# Patient Record
Sex: Female | Born: 1980 | Hispanic: Yes | Smoking: Never smoker
Health system: Southern US, Community
[De-identification: ages and names within clinical notes are randomized; demographics above are authoritative.]

## PROBLEM LIST (undated history)

## (undated) DIAGNOSIS — Z789 Other specified health status: Secondary | ICD-10-CM

## (undated) HISTORY — PX: NO PAST SURGERIES: SHX2092

## (undated) HISTORY — DX: Other specified health status: Z78.9

---

## 2018-11-17 ENCOUNTER — Ambulatory Visit (HOSPITAL_COMMUNITY): Payer: Self-pay | Admitting: Obstetrics & Gynecology

## 2018-11-17 ENCOUNTER — Other Ambulatory Visit (HOSPITAL_COMMUNITY): Payer: Self-pay | Admitting: Obstetrics & Gynecology

## 2018-11-17 ENCOUNTER — Ambulatory Visit (HOSPITAL_COMMUNITY)
Admission: RE | Admit: 2018-11-17 | Discharge: 2018-11-17 | Disposition: A | Discharge status: Home / Self Care | Payer: Self-pay | Source: Ambulatory Visit | Attending: Obstetrics and Gynecology | Admitting: Obstetrics and Gynecology

## 2018-11-17 ENCOUNTER — Encounter (HOSPITAL_COMMUNITY): Payer: Self-pay

## 2018-11-17 ENCOUNTER — Ambulatory Visit (HOSPITAL_COMMUNITY): Payer: Self-pay | Admitting: Obstetrics and Gynecology

## 2018-11-17 ENCOUNTER — Ambulatory Visit (HOSPITAL_COMMUNITY): Payer: Self-pay

## 2018-11-17 ENCOUNTER — Other Ambulatory Visit: Payer: Self-pay

## 2018-11-17 ENCOUNTER — Ambulatory Visit (HOSPITAL_COMMUNITY): Payer: Self-pay | Admitting: *Deleted

## 2018-11-17 VITALS — BP 113/63 | HR 76 | Temp 98.1°F | Ht 63.0 in | Wt 155.2 lb

## 2018-11-17 DIAGNOSIS — O09521 Supervision of elderly multigravida, first trimester: Secondary | ICD-10-CM

## 2018-11-17 DIAGNOSIS — Z3682 Encounter for antenatal screening for nuchal translucency: Secondary | ICD-10-CM

## 2018-11-17 DIAGNOSIS — O09529 Supervision of elderly multigravida, unspecified trimester: Secondary | ICD-10-CM

## 2018-11-17 DIAGNOSIS — Z3A11 11 weeks gestation of pregnancy: Secondary | ICD-10-CM

## 2018-11-17 DIAGNOSIS — Z3687 Encounter for antenatal screening for uncertain dates: Secondary | ICD-10-CM

## 2018-11-17 IMAGING — US US MFM FETAL NUCHAL TRANSLUCENCY
1 series · 14 of 28 positions shown · non-contrast
Comparison: none

[Series 1: us mfm fetal nuchal translucency · 14 of 33 slices shown]
[im 2/33]
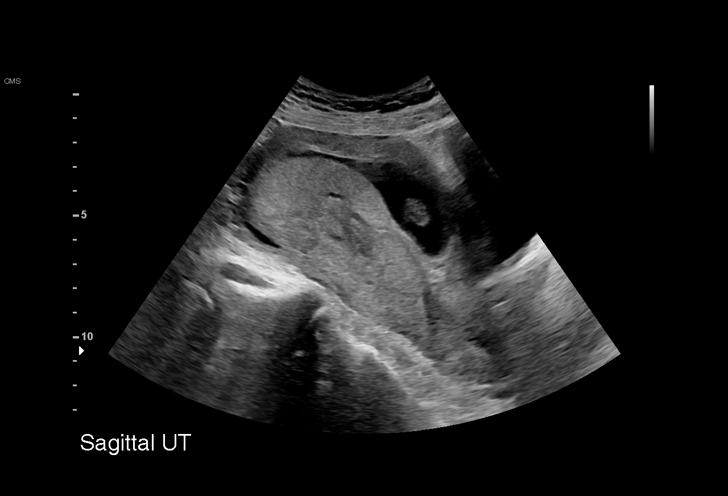
[im 4/33]
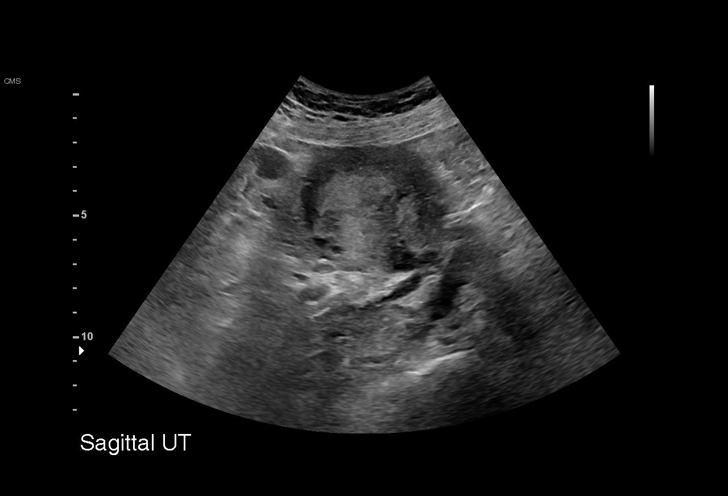
[im 6/33]
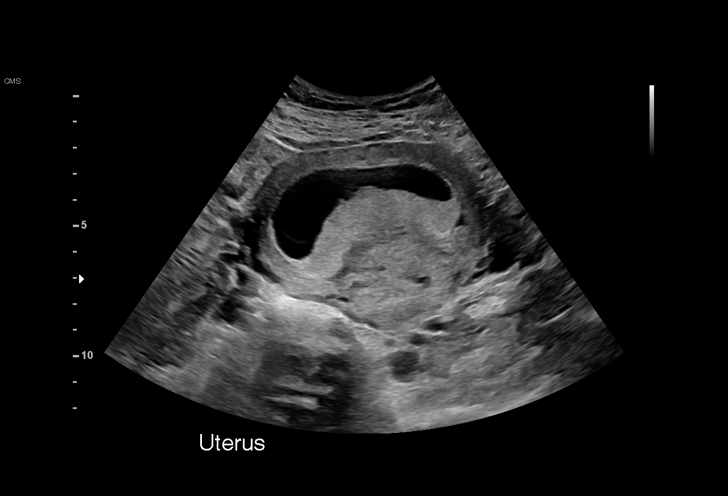
[im 9/33]
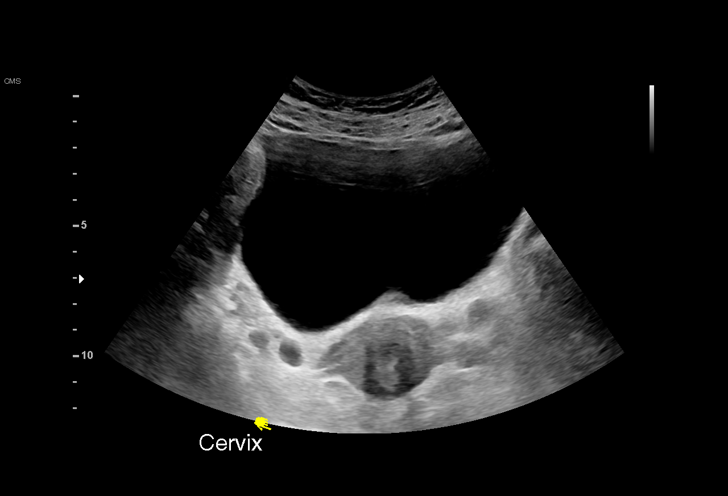
[im 11/33]
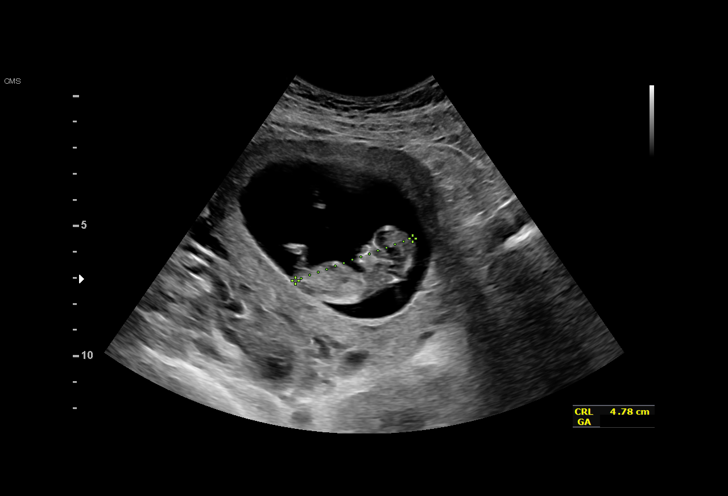
[im 14/33]
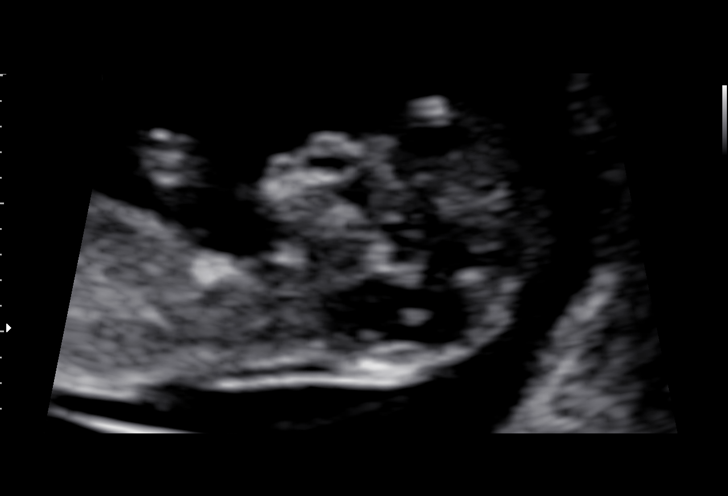
[im 16/33]
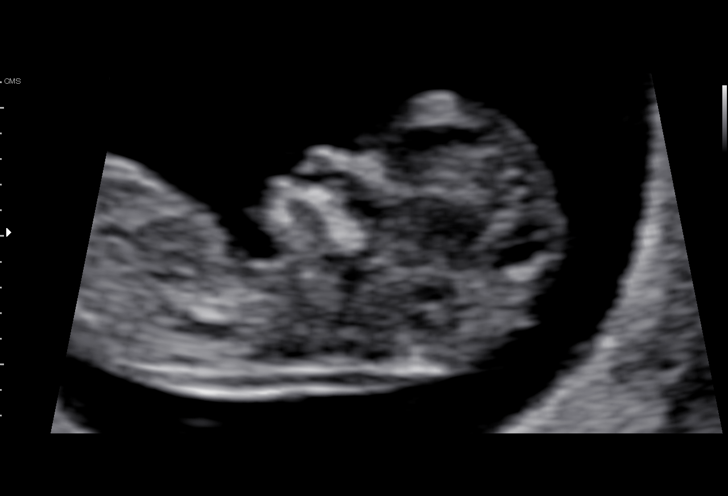
[im 18/33]
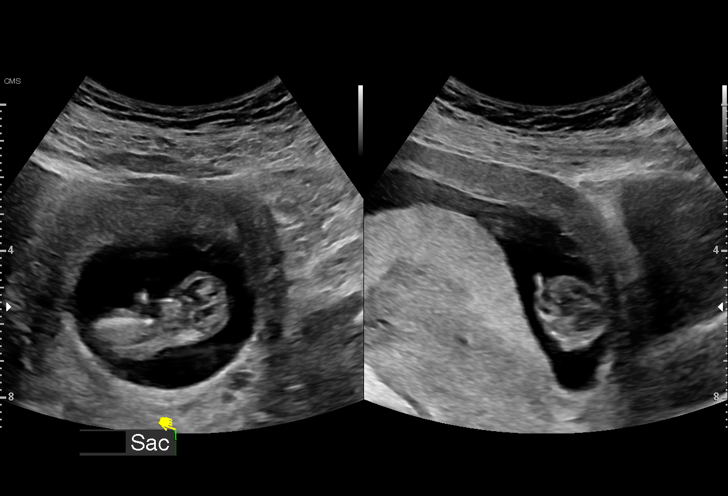
[im 21/33]
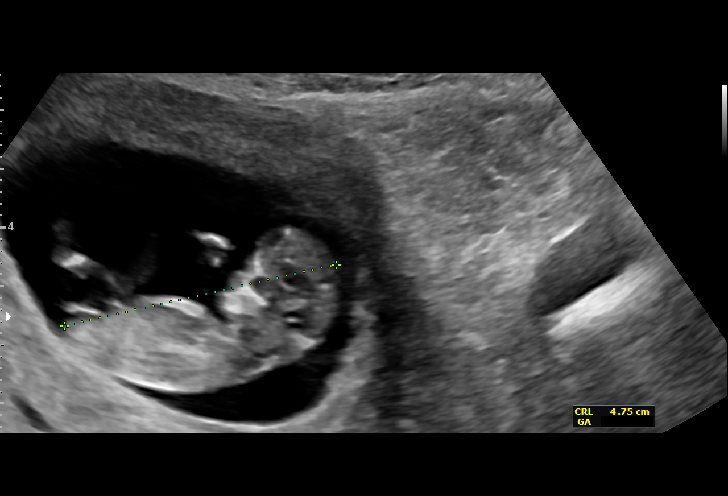
[im 23/33]
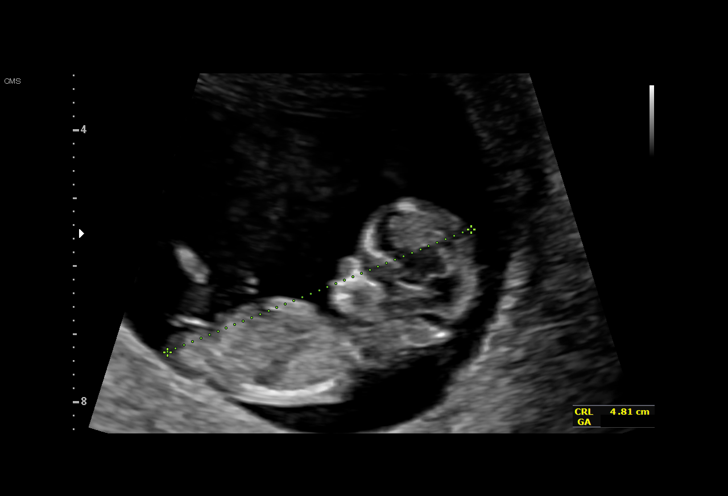
[im 25/33]
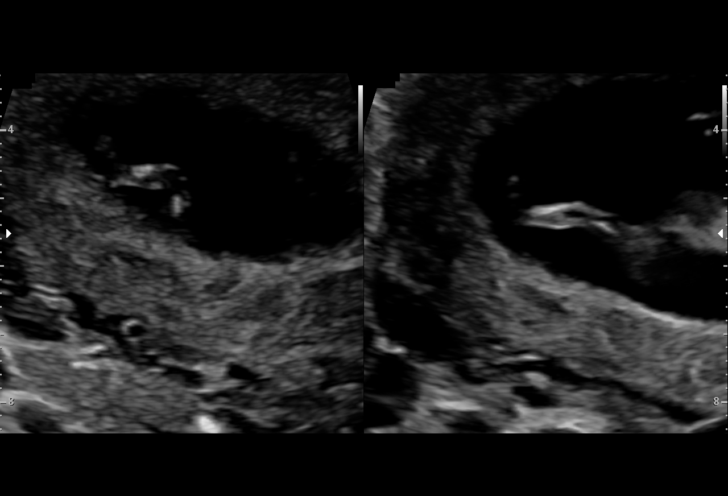
[im 28/33]
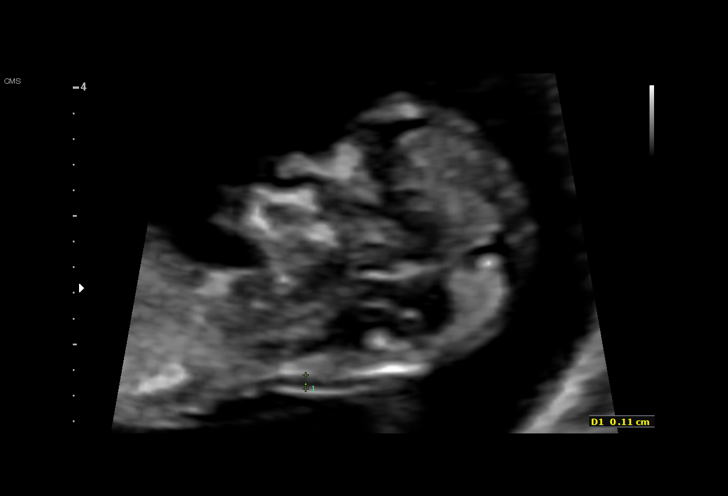
[im 30/33]
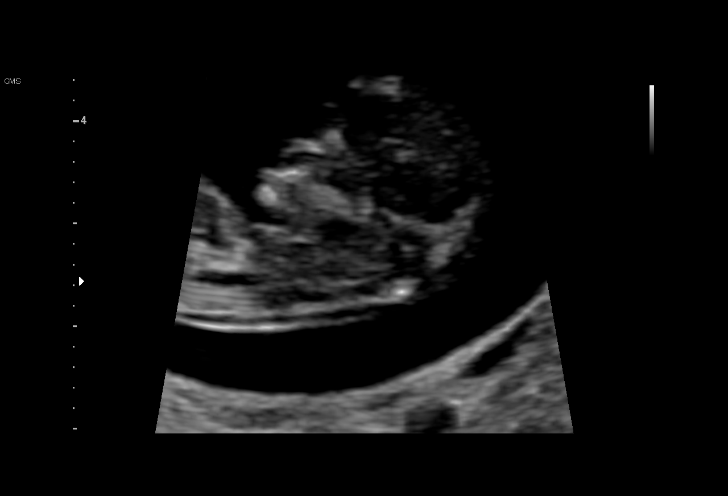
[im 33/33]
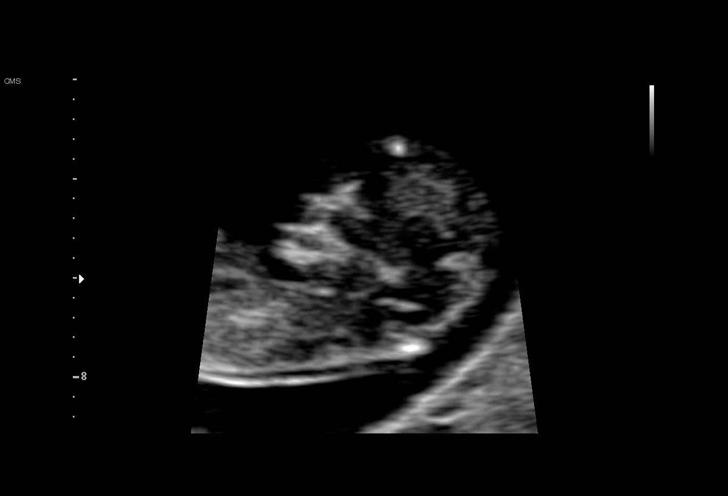

[14 of 28 positions shown; findings below may reference images not displayed]

[REDACTED]-
                   Faculty Physician

     TRANSLUCENCY
 ----------------------------------------------------------------------

 ----------------------------------------------------------------------
Indications

  Encounter for uncertain dates                  [VR]
  Encounter for nuchal translucency              [VR]
  Advanced maternal age multigravida 38, first   [VR]
  trimester
  11 weeks gestation of pregnancy
 ----------------------------------------------------------------------
Fetal Evaluation

 Num Of Fetuses:         1
 Preg. Location:         Intrauterine
 Gest. Sac:              Intrauterine
 Yolk Sac:               Visualized
 Fetal Pole:             Visualized
 Fetal Heart Rate(bpm):  167
 Cardiac Activity:       Observed

 Amniotic Fluid
 AFI FV:      Within normal limits
Biometry

 CRL:      48.1  mm     G. Age:  11w 3d                  EDD:   [DATE]
OB History

 Gravidity:    6         Term:   4        Prem:   0        SAB:   1
 TOP:          0       Ectopic:  0        Living: 4
Gestational Age

 LMP:           13w 1d        Date:  [DATE]                 EDD:   [DATE]
 Best:          11w 3d     Det. By:  U/S C R L ([DATE])     EDD:   [DATE]
1st Trimester Genetic Sonogram Screening

 CRL:            48.1  mm    G. Age:   11w 3d                 EDD:   [DATE]
 Nuc Trans:       1.1  mm

 Nasal Bone:                 Present
Cervix Uterus Adnexa

 Cervix
 Closed.

 Uterus
 No abnormality visualized.

 Left Ovary
 Not visualized.

 Right Ovary
 Not visualized.

 Cul De Sac
 No free fluid seen.

 Adnexa
 No abnormality visualized.
Impression

 On ultrasound, the CRL measurement is consistent with 11w
 1d gestation and good fetal heart activity is seen. The nuchal
 translucency (NT) measures 1.1 millimeters, which is normal.
 Fetal anatomy that could be ascertained at this gestational
 age is normal.

 We have assigned her EDD at [DATE].

 Patient met with our genetic counselor today after ultrasound.
 You will be receiving a separate letter. Patient opted to have
 cell-free fetal DNA screening. Blood was drawn today for cell-
 free fetal DNA screening and inheritest. We will communicate
 the results to the patient.
Recommendations

 Fetal anatomy scan at 18-20 weeks.
                 ARGAZ

## 2018-11-17 NOTE — Progress Notes (Signed)
Patient in session with Genetic Counselor, Control and instrumentation engineer. Interpreter present. Patient to have Inheritest Core and Mat 21 drawn today.

## 2018-12-01 LAB — OB RESULTS CONSOLE ANTIBODY SCREEN: Antibody Screen: NEGATIVE

## 2018-12-01 LAB — OB RESULTS CONSOLE HEPATITIS B SURFACE ANTIGEN: Hepatitis B Surface Ag: NEGATIVE

## 2018-12-01 LAB — OB RESULTS CONSOLE RUBELLA ANTIBODY, IGM: Rubella: NON-IMMUNE/NOT IMMUNE

## 2018-12-01 LAB — OB RESULTS CONSOLE HIV ANTIBODY (ROUTINE TESTING): HIV: NONREACTIVE

## 2018-12-01 LAB — OB RESULTS CONSOLE VARICELLA ZOSTER ANTIBODY, IGG: Varicella: IMMUNE

## 2018-12-01 LAB — OB RESULTS CONSOLE GC/CHLAMYDIA
Chlamydia: NEGATIVE
Gonorrhea: NEGATIVE

## 2018-12-01 LAB — OB RESULTS CONSOLE ABO/RH: RH Type: POSITIVE

## 2018-12-05 LAB — MATERNIT 21 PLUS CORE, BLOOD
Fetal Fraction: 11
Result (T21): NEGATIVE
Trisomy 13 (Patau syndrome): NEGATIVE
Trisomy 18 (Edwards syndrome): NEGATIVE
Trisomy 21 (Down syndrome): NEGATIVE

## 2018-12-05 LAB — INHERITEST CORE(CF97,SMA,FRAX)

## 2019-01-31 ENCOUNTER — Encounter (HOSPITAL_COMMUNITY): Payer: Self-pay

## 2019-01-31 ENCOUNTER — Inpatient Hospital Stay (HOSPITAL_BASED_OUTPATIENT_CLINIC_OR_DEPARTMENT_OTHER): Payer: Self-pay

## 2019-01-31 ENCOUNTER — Other Ambulatory Visit: Payer: Self-pay

## 2019-01-31 ENCOUNTER — Inpatient Hospital Stay (HOSPITAL_COMMUNITY)
Admission: AD | Admit: 2019-01-31 | Discharge: 2019-01-31 | Disposition: A | Discharge status: Home / Self Care | Payer: Self-pay | Attending: Obstetrics & Gynecology | Admitting: Obstetrics & Gynecology

## 2019-01-31 DIAGNOSIS — R1030 Lower abdominal pain, unspecified: Secondary | ICD-10-CM | POA: Insufficient documentation

## 2019-01-31 DIAGNOSIS — O26899 Other specified pregnancy related conditions, unspecified trimester: Secondary | ICD-10-CM

## 2019-01-31 DIAGNOSIS — R109 Unspecified abdominal pain: Secondary | ICD-10-CM

## 2019-01-31 DIAGNOSIS — O26892 Other specified pregnancy related conditions, second trimester: Secondary | ICD-10-CM | POA: Insufficient documentation

## 2019-01-31 DIAGNOSIS — T7431XS Adult psychological abuse, confirmed, sequela: Secondary | ICD-10-CM

## 2019-01-31 DIAGNOSIS — F439 Reaction to severe stress, unspecified: Secondary | ICD-10-CM

## 2019-01-31 DIAGNOSIS — Z3A22 22 weeks gestation of pregnancy: Secondary | ICD-10-CM

## 2019-01-31 LAB — URINALYSIS, ROUTINE W REFLEX MICROSCOPIC
Bilirubin Urine: NEGATIVE
Glucose, UA: NEGATIVE mg/dL
Hgb urine dipstick: NEGATIVE
Ketones, ur: 5 mg/dL — AB
Leukocytes,Ua: NEGATIVE
Nitrite: NEGATIVE
Protein, ur: NEGATIVE mg/dL
Specific Gravity, Urine: 1.006 (ref 1.005–1.030)
pH: 6 (ref 5.0–8.0)

## 2019-01-31 MED ORDER — IBUPROFEN 600 MG PO TABS
600.0000 mg | ORAL_TABLET | Freq: Once | ORAL | Status: AC
  Administered 2019-01-31: 600 mg via ORAL
  Filled 2019-01-31: qty 1
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
## 2019-01-31 NOTE — Discharge Instructions (Signed)
Violencia domstica y Radio broadcast assistant Violence and Pregnancy La violencia domstica es un tipo de dao fsico, sexual o emocional infligido por una ex o actual pareja. El abuso emocional incluye la conducta amenazante y de control. El acoso es un ejemplo de Scientist, physiological. Este tipo de abuso puede sucederle a Copy, en cualquier momento de una relacin. A la violencia domstica tambin se la conoce como violencia de pareja. La violencia de pareja es especialmente peligrosa durante el embarazo porque el abuso puede afectarlos tanto a usted como al beb en desarrollo. Llame a la lnea directa para violencia domstica o informe al mdico si est sufriendo abuso fsico o sexual, o si la Scientist, physiological o de control de su pareja hace que no se sienta segura. Jake Bathe y [employer] protege a usted, su embarazo y su beb. De qu modo la afecta a usted? Para usted, los [employer] de la violencia de pareja pueden incluir:  Lesin fsica o muerte (homicidio).  Estrs emocional y miedo.  Dificultad para dormir.  Prdida del apetito.  Problemas digestivos.  Infecciones frecuentes, incluso infecciones de transmisin sexual.  Presin arterial alta.  Depresin.  Ansiedad.  Pensamientos acerca de lastimarse o suicidarse. La violencia de pareja puede afectar de modo negativo su embarazo de estas formas:  Es ms probable que se lesione o que su salud sea deficiente.  Es menos probable que [place] cuidado prenatal.  Es posible que no tenga una buena nutricin ni que gane una cantidad saludable de [place].  Es ms probable que fume, consuma drogas y beba alcohol durante el embarazo para Programmer, multimedia.  Puede tener un riesgo mayor de perder Water quality scientist (aborto espontneo o muerte fetal intrauterina).  Su beb puede nacer antes de las 37semanas de embarazo (prematuro). De qu modo lo afecta al beb? Si sufre violencia de pareja durante el embarazo:  Su beb puede  lesionarse.  Es posible que el beb no sobreviva el embarazo (aborto espontneo o muerte fetal intrauterina).  El beb puede nacer prematuro, lo que puede provocar problemas mentales y fsicos.  El beb puede no crecer bien en el tero y puede nacer pequeo (pequeo para la edad gestacional).  Si consume alcohol, el beb puede nacer con sndrome alcohlico fetal, que produce defectos congnitos y [employer].  Es posible que tenga problemas para crear un vnculo con el beb, lo que puede conducir a negligencia.  Es menos probable que amamante, que es la forma ms saludable de Research scientist (life sciences) al beb. Siga estas indicaciones en su casa:   Informe al mdico que est sufriendo violencia de pareja.  No lleve a una pareja abusiva a las visitas prenatales. Esto le permitir hablar libremente con su mdico.  Infrmese sobre recursos sobre violencia de pareja y selos, por ejemplo la Lnea Directa Nacional sobre Violencia Domstica (National Domestic Violence Hotline): VisitDestination.com.br.  Pharmacologist terapia.  Considere obtener un documento legal que diga que su pareja debe mantenerse alejada de usted (interdicto).  Pdale a una persona de apoyo que se quede con usted en su casa.  Tenga un plan de escape para ir a un sitio seguro.  No fume ni consuma drogas ni alcohol para aliviar el estrs.  Concurra a todas las visitas prenatales tal como se lo haya indicado el mdico. Esto es importante. Dnde encontrar ms informacin:  Futuros sin Violencia (Futures Without Violence): futureswithoutviolence.[place] ([place]): https://taylor-mendoza.com/  Red Nacional para Terminar con la [employer]  Domstica Advertising copywriter to UnitedHealth Violence): InsuranceSquad.es  [employer] de Recursos sobre Catering manager ([employer] on Domestic Violence): ARanked.fi  El Departamento de Justicia de los Estados Unidos (U.S.  Department of Justice), Oficina sobre Violencia contra Architectural technologist (Office on Violence Against Women): BeginnerSteps.com.ee Comunquese con un mdico si:  Sufre cualquier tipo de violencia de pareja en su casa.  Necesita ayuda para dejar de fumar, beber o consumir drogas. Solicite ayuda de inmediato si:  No se siente seguro en su casa.  Tiene pensamientos acerca de lastimarse o suicidarse. Si alguna vez siente que puede lastimarse a usted mismo o a Economist, o tiene pensamientos de poner fin a su vida, busque ayuda de inmediato. Puede dirigirse al servicio de emergencias ms cercano o comunicarse con:  El servicio de emergencias de su localidad (911 en [place].).  Una lnea de asistencia al suicida y Visual merchandiser en crisis, como la [employer] de Prevencin del Suicidio (National Suicide Prevention Lifeline), al [phone]. Est disponible las 24 horas del da. Resumen  A la violencia domstica tambin se la conoce como violencia de pareja. La violencia de pareja puede ser fsica, sexual o emocional.  Este tipo de abuso puede sucederle a cualquiera, en cualquier momento de Administrator, arts, pero es especialmente peligrosa durante el embarazo.  La violencia de pareja incrementa el riesgo de aborto espontneo, muerte fetal intrauterina y nacimiento prematuro del beb.  Informe a su mdico si sufre cualquier tipo de violencia de pareja. Obtenga ayuda de inmediato si no se siente segura en su casa. Esta informacin no tiene Theme park manager el consejo del mdico. Asegrese de hacerle al mdico cualquier pregunta que tenga. Document Released: 03/27/2017 Document Revised: 03/27/2017 Document Reviewed: 03/27/2017 Elsevier Patient Education  2020 [employer].

## 2019-01-31 NOTE — Progress Notes (Signed)
[name] went to speak with pt at bedside as [name] mae aware that patient has requested to speak with [name]. [name] entered room with spanish speaking interpretor Alex. [name] introduced role and advised pt of the reason for [name] coming to visit with her. As [name] and pt began to talk, another individual entered the room from ultrasound to get pt. [name] advised pt that [name] would wait for her to return as [name] made aware that it would only be 10 minutes. [name] updated RN to call [name] once pt has returned to the room. RN agreeable.     Virgie Dad Kameisha Malicki, MSW, LCSW [facility] [phone]
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
## 2019-04-24 NOTE — L&D Delivery Note (Signed)
OB/GYN Faculty Practice Delivery Note  Patricia House is a 39 y.o. [id number] s/p NSVD at [redacted]w[redacted]d. She was admitted for spontaneous labor.   ROM: 1h 37m with clear fluid GBS Status: unknown    Labor Progress: . Patient arrived at full dilation.   Delivery Date/Time: 06/01/2019 at 0124 Delivery: Called to room and patient was complete and pushing. Head delivered in LOA position. No nuchal cord present. Shoulder and body delivered in usual fashion. Infant with spontaneous cry, placed on mother's abdomen, dried and stimulated. Cord clamped x 2 after 1-minute delay, and cut by [name]. Cord blood drawn. Placenta delivered spontaneously with gentle cord traction. Fundus firm with massage and Pitocin. Labia, perineum, vagina, and cervix inspected with L periuretheral that was hemostatic and not repaired.   Placenta: 3v intact, to L&D Complications: none Lacerations: L periuretheral that was hemostatic and not repaired EBL: 300 cc Analgesia: none   Infant: APGAR (1 MIN): 8   APGAR (5 MINS): 9    Weight: 3430 grams  Zack Seal, MD/MPH OB/GYN Fellow, Faculty Practice

## 2019-06-01 ENCOUNTER — Encounter (HOSPITAL_COMMUNITY): Payer: Self-pay | Admitting: Obstetrics and Gynecology

## 2019-06-01 ENCOUNTER — Inpatient Hospital Stay (HOSPITAL_COMMUNITY)
Admission: AD | Admit: 2019-06-01 | Discharge: 2019-06-02 | DRG: 807 | Disposition: A | Discharge status: Home / Self Care | Payer: Medicaid Other | Attending: Obstetrics and Gynecology | Admitting: Obstetrics and Gynecology

## 2019-06-01 DIAGNOSIS — Z3A39 39 weeks gestation of pregnancy: Secondary | ICD-10-CM

## 2019-06-01 DIAGNOSIS — Z20822 Contact with and (suspected) exposure to covid-19: Secondary | ICD-10-CM | POA: Diagnosis present

## 2019-06-01 DIAGNOSIS — O26893 Other specified pregnancy related conditions, third trimester: Secondary | ICD-10-CM | POA: Diagnosis present

## 2019-06-01 LAB — CBC
HCT: 37.7 % (ref 36.0–46.0)
Hemoglobin: 12.9 g/dL (ref 12.0–15.0)
MCH: 28.1 pg (ref 26.0–34.0)
MCHC: 34.2 g/dL (ref 30.0–36.0)
MCV: 82.1 fL (ref 80.0–100.0)
Platelets: 302 10*3/uL (ref 150–400)
RBC: 4.59 MIL/uL (ref 3.87–5.11)
RDW: 14.1 % (ref 11.5–15.5)
WBC: 11.3 10*3/uL — ABNORMAL HIGH (ref 4.0–10.5)
nRBC: 0 % (ref 0.0–0.2)

## 2019-06-01 LAB — ABO/RH: ABO/RH(D): A POS

## 2019-06-01 LAB — TYPE AND SCREEN
ABO/RH(D): A POS
Antibody Screen: NEGATIVE

## 2019-06-01 LAB — RESPIRATORY PANEL BY RT PCR (FLU A&B, COVID)
Influenza A by PCR: NEGATIVE
Influenza B by PCR: NEGATIVE
SARS Coronavirus 2 by RT PCR: NEGATIVE

## 2019-06-01 LAB — GROUP B STREP BY PCR: Group B strep by PCR: NEGATIVE

## 2019-06-01 LAB — RPR: RPR Ser Ql: NONREACTIVE

## 2019-06-01 MED ORDER — PRENATAL MULTIVITAMIN CH
1.0000 | ORAL_TABLET | Freq: Every day | ORAL | Status: DC
  Administered 2019-06-01 – 2019-06-02 (×2): 1 via ORAL
  Filled 2019-06-01 (×2): qty 1

## 2019-06-01 MED ORDER — IBUPROFEN 600 MG PO TABS
600.0000 mg | ORAL_TABLET | Freq: Four times a day (QID) | ORAL | Status: DC
  Administered 2019-06-01 – 2019-06-02 (×6): 600 mg via ORAL
  Filled 2019-06-01 (×6): qty 1

## 2019-06-01 MED ORDER — OXYCODONE HCL 5 MG PO TABS: 10.0000 mg | ORAL_TABLET | ORAL | Status: DC | PRN

## 2019-06-01 MED ORDER — DIBUCAINE (PERIANAL) 1 % EX OINT: 1.0000 "application " | TOPICAL_OINTMENT | CUTANEOUS | Status: DC | PRN

## 2019-06-01 MED ORDER — OXYTOCIN 40 UNITS IN NORMAL SALINE INFUSION - SIMPLE MED
2.5000 [IU]/h | INTRAVENOUS | Status: DC
  Administered 2019-06-01: 39.96 [IU]/h via INTRAVENOUS

## 2019-06-01 MED ORDER — METHYLERGONOVINE MALEATE 0.2 MG/ML IJ SOLN
INTRAMUSCULAR | Status: AC
  Filled 2019-06-01: qty 1

## 2019-06-01 MED ORDER — MAGNESIUM HYDROXIDE 400 MG/5ML PO SUSP: 30.0000 mL | ORAL | Status: DC | PRN

## 2019-06-01 MED ORDER — ONDANSETRON HCL 4 MG PO TABS: 4.0000 mg | ORAL_TABLET | ORAL | Status: DC | PRN

## 2019-06-01 MED ORDER — ONDANSETRON HCL 4 MG/2ML IJ SOLN: 4.0000 mg | INTRAMUSCULAR | Status: DC | PRN

## 2019-06-01 MED ORDER — SENNOSIDES-DOCUSATE SODIUM 8.6-50 MG PO TABS
2.0000 | ORAL_TABLET | ORAL | Status: DC
  Administered 2019-06-02: 2 via ORAL
  Filled 2019-06-01: qty 2

## 2019-06-01 MED ORDER — COCONUT OIL OIL: 1.0000 "application " | TOPICAL_OIL | Status: DC | PRN

## 2019-06-01 MED ORDER — DIPHENHYDRAMINE HCL 25 MG PO CAPS: 25.0000 mg | ORAL_CAPSULE | Freq: Four times a day (QID) | ORAL | Status: DC | PRN

## 2019-06-01 MED ORDER — OXYCODONE HCL 5 MG PO TABS: 5.0000 mg | ORAL_TABLET | ORAL | Status: DC | PRN

## 2019-06-01 MED ORDER — WITCH HAZEL-GLYCERIN EX PADS: 1.0000 "application " | MEDICATED_PAD | CUTANEOUS | Status: DC | PRN

## 2019-06-01 MED ORDER — METHYLERGONOVINE MALEATE 0.2 MG/ML IJ SOLN
0.2000 mg | Freq: Once | INTRAMUSCULAR | Status: AC
  Administered 2019-06-01: 0.2 mg via INTRAMUSCULAR

## 2019-06-01 MED ORDER — MEDROXYPROGESTERONE ACETATE 150 MG/ML IM SUSP: 150.0000 mg | Freq: Once | INTRAMUSCULAR | Status: DC

## 2019-06-01 MED ORDER — ACETAMINOPHEN 325 MG PO TABS: 650.0000 mg | ORAL_TABLET | ORAL | Status: DC | PRN

## 2019-06-01 MED ORDER — BENZOCAINE-MENTHOL 20-0.5 % EX AERO: 1.0000 "application " | INHALATION_SPRAY | CUTANEOUS | Status: DC | PRN

## 2019-06-01 MED ORDER — SIMETHICONE 80 MG PO CHEW: 80.0000 mg | CHEWABLE_TABLET | ORAL | Status: DC | PRN

## 2019-06-01 MED ORDER — MEASLES, MUMPS & RUBELLA VAC IJ SOLR
0.5000 mL | Freq: Once | INTRAMUSCULAR | Status: AC
  Administered 2019-06-02: 0.5 mL via SUBCUTANEOUS
  Filled 2019-06-01: qty 0.5

## 2019-06-01 NOTE — ED Provider Notes (Signed)
MSE was initiated and I personally evaluated the patient and placed orders (if any) at  1:01 AM on June 01, 2019.  The patient appears stable so that the remainder of the MSE may be completed by another provider.   Nonie Lochner, Barbara Cower, MD 06/02/19 [phone]

## 2019-06-01 NOTE — Discharge Summary (Signed)
Postpartum Discharge Summary     Patient Name: Patricia House DOB: 1980/08/09 MRN: [id number]  Date of admission: 06/01/2019 Delivering Provider: Clarnce Flock   Date of discharge: 06/02/2019  Admitting diagnosis: NSVD (normal spontaneous vaginal delivery) [O80] Intrauterine pregnancy: [redacted]w[redacted]d    Secondary diagnosis:  Active Problems:   NSVD (normal spontaneous vaginal delivery)  Additional problems: Precipitous delivery     Discharge diagnosis: Term Pregnancy Delivered                                                                                                Post partum procedures: None  Augmentation: none  Complications: None  Hospital course:  Onset of Labor With Vaginal Delivery     39y.o. yo [id number] admitted in Active Labor on 06/01/2019. Patient had an uncomplicated labor course as follows: arrived fully dilated after SROM for clear at home and precipitously delivered Membrane Rupture Time/Date: 12:00 AM ,06/01/2019   Intrapartum Procedures: Episiotomy: None [1]                                         Lacerations:  Periurethral [8]  Patient had a delivery of a Viable infant. 06/01/2019  Information for the patient's newborn:  Ramos-Reyes, Girl DaCarmella0[[id number]]Delivery Method: Vag-Spont     Pateint had an uncomplicated postpartum course.  She is ambulating, tolerating a regular diet, passing flatus, and urinating well. Patient is discharged home in stable condition on 06/02/19.  Delivery time: 1:24 AM    Magnesium Sulfate received: No BMZ received: No Rhophylac:N/A MMR:Yes Transfusion:No  Physical exam  Vitals:   06/01/19 0900 06/01/19 1438 06/01/19 2141 06/02/19 0533  BP: 102/67 98/60 (!) 97/58 (!) 90/50  Pulse: 82 82 64 64  Resp: 17 17  18   Temp: 98.2 F (36.8 C) 98.4 F (36.9 C) 98.8 F (37.1 C) (!) 97.5 F (36.4 C)  TempSrc: Oral Oral Oral Oral  SpO2:  98%     General: alert, cooperative and no distress Lochia:  appropriate Uterine Fundus: firm Incision: N/A DVT Evaluation: No evidence of DVT seen on physical exam. Labs: Lab Results  Component Value Date   WBC 11.3 (H) 06/01/2019   HGB 12.9 06/01/2019   HCT 37.7 06/01/2019   MCV 82.1 06/01/2019   PLT 302 06/01/2019   No flowsheet data found.  Discharge instruction: per After Visit Summary and "Baby and Me Booklet".  After visit meds:  Allergies as of 06/02/2019   No Known Allergies     Medication List    TAKE these medications   acetaminophen 325 MG tablet Commonly known as: Tylenol Take 2 tablets (650 mg total) by mouth every 4 (four) hours as needed (for pain scale < 4).   ibuprofen 600 MG tablet Commonly known as: ADVIL Take 1 tablet (600 mg total) by mouth every 6 (six) hours.   oxyCODONE 5 MG immediate release tablet Commonly known as: Oxy IR/ROXICODONE Take 1 tablet (5 mg total) by mouth every 4 (  four) hours as needed (pain scale 4-7).   PRENATAL VITAMIN PO Take by mouth.       Diet: routine diet  Activity: Advance as tolerated. Pelvic rest for 6 weeks.   Outpatient follow up:6 weeks Follow up Appt:No future appointments. Follow up Visit:    Please schedule this patient for Postpartum visit in: 6 weeks with the following provider: Any provider In-Person For C/S patients schedule nurse incision check in weeks 2 weeks: no Low risk pregnancy complicated by: n/a Delivery mode:  SVD Anticipated Birth Control:  Depo prior to discharge, would like LARC at follow PP Procedures needed: LARC placement  Schedule Integrated [place] visit: no     Newborn Data: Live born female  Birth Weight:  3430 g APGAR: 50, 9  Newborn Delivery   Birth date/time: 06/01/2019 01:24:00 Delivery type: Vaginal, Spontaneous      Baby Feeding: Breast Disposition:home with mother   06/02/2019 Merilyn Baba, DO

## 2019-06-01 NOTE — Lactation Note (Addendum)
This note was copied from a baby's chart. Lactation Consultation Note  Patient Name: Patricia House Date: 06/01/2019 Reason for consult: Initial assessment;Term P5, 3 hour female infant. Spanish interpreter used # [phone] Lenor Coffin is active on the [facility] program in [facility] she doesn't have a breast pump at home.  Mom is experienced at breastfeeding she breastfeed her other 4 children for 4 years each. Mom feels infant is breastfeeding well and has no concerns at this time, mom was breastfeeding infant on her right breast in side lying position when [facility] enter room LC did not assist mom with latch.  Infant breastfeed for 7 minutes.  Mom will breastfeed infant according hunger cues, on demand, 8 to 12 times within 24 hours and not exceed 3 hours without breastfeeding infant. Mom knows to call RN or LC if she has any questions, concerns or need any assistance with latching infant. Mom shown how to use hand pump  & how to disassemble, clean, & reassemble parts. Reviewed Baby & Me book's Breastfeeding Basics.  Mom made aware of O/P services, breastfeeding support groups, community resources, and our phone # for post-discharge questions.   Maternal Data Formula Feeding for Exclusion: No Has patient been taught Hand Expression?: Yes Does the patient have breastfeeding experience prior to this delivery?: Yes  Feeding Feeding Type: Breast Fed  LATCH Score Latch: Grasps breast easily, tongue down, lips flanged, rhythmical sucking.  Audible Swallowing: A few with stimulation  Type of Nipple: Everted at rest and after stimulation  Comfort (Breast/Nipple): Soft / non-tender  Hold (Positioning): No assistance needed to correctly position infant at breast.  LATCH Score: 9  Interventions Interventions: Breast feeding basics reviewed;Skin to skin  Lactation Tools Discussed/Used WIC Program: Yes Pump Review: Setup, frequency, and cleaning;Milk Storage Initiated by::  Danelle Earthly, IBCLC Date initiated:: 06/01/19   Consult Status Consult Status: Follow-up Date: 06/01/19 Follow-up type: In-patient    Danelle Earthly 06/01/2019, 4:30 AM
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
## 2019-06-01 NOTE — MAU Note (Signed)
Pt transferred via wheelchair from ED and left at registration desk. Pt SROM'd at home and has an urge to push. Pt 10/100/+1 and vertex. L&D charge nurse called and given report. Pt to go to room 216. Transferred via stretcher by RN, CNM, and NT.

## 2019-06-01 NOTE — Lactation Note (Signed)
This note was copied from a baby's chart. Lactation Consultation Note  Patient Name: Patricia House JYLTE'I Date: 06/01/2019 Reason for consult: Follow-up assessment   P5 mother whose infant is now 7 hours old.  Mother breast fed her other 4 children for 2 years each.  The youngest one is 37 years old.  In house Spanish interpreter used for interpretation.  Baby was swaddled and asleep at mother's side when I arrived.  Mother had no questions/concerns related to breast feeding.  Encouraged to feed 8-12 times/24 hours or sooner if baby shows feeding cues.  She will call for latch assistance as needed.  Mother does not have a DEBP for home use.  She does not have a desire to obtain a DEBP.  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  Father present.  RN updated.      Consult Status Consult Status: Follow-up Date: 06/02/19 Follow-up type: In-patient    Patricia House 06/01/2019, 10:18 AM

## 2019-06-01 NOTE — H&P (Signed)
LABOR AND DELIVERY ADMISSION HISTORY AND PHYSICAL NOTE  Patricia House is a 38 y.o. female G6P4014 with IUP at [redacted]w[redacted]d by patient report presenting for spontaneous labor.   Patient reported she had SROM at home around 0000 Delivered precipitously after arrival to the floor  She plans on breast feeding. Her contraception plan is: Depo.  Prenatal History/Complications: PNC at [facility] No   Pregnancy complications:  - None  Past Medical History: Past Medical History:  Diagnosis Date  . Medical history non-contributory     Past Surgical History: Past Surgical History:  Procedure Laterality Date  . NO PAST SURGERIES      Obstetrical History: OB History    Gravida  6   Para  4   Term  4   Preterm      AB  1   Living  4     SAB  1   TAB      Ectopic      Multiple      Live Births              Social History: Social History   Socioeconomic History  . Marital status: Unknown    Spouse name: Not on file  . Number of children: Not on file  . Years of education: Not on file  . Highest education level: Not on file  Occupational History  . Not on file  Tobacco Use  . Smoking status: Never Smoker  . Smokeless tobacco: Never Used  Substance and Sexual Activity  . Alcohol use: Not Currently  . Drug use: Never  . Sexual activity: Yes    Birth control/protection: None  Other Topics Concern  . Not on file  Social History Narrative  . Not on file   Social Determinants of Health   Financial Resource Strain:   . Difficulty of Paying Living Expenses: Not on file  Food Insecurity:   . Worried About Running Out of Food in the Last Year: Not on file  . Ran Out of Food in the Last Year: Not on file  Transportation Needs:   . Lack of Transportation (Medical): Not on file  . Lack of Transportation (Non-Medical): Not on file  Physical Activity:   . Days of Exercise per Week: Not on file  . Minutes of Exercise per Session: Not on file  Stress:   .  Feeling of Stress : Not on file  Social Connections:   . Frequency of Communication with Friends and Family: Not on file  . Frequency of Social Gatherings with Friends and Family: Not on file  . Attends Religious Services: Not on file  . Active Member of Clubs or Organizations: Not on file  . Attends Club or Organization Meetings: Not on file  . Marital Status: Not on file    Family History: No family history on file.  Allergies: No Known Allergies  Medications Prior to Admission  Medication Sig Dispense Refill Last Dose  . Prenatal Vit-Fe Fumarate-FA (PRENATAL VITAMIN PO) Take by mouth.        Review of Systems  All systems reviewed and negative except as stated in HPI  Physical Exam Blood pressure 121/76, pulse (!) 134, resp. rate (!) 22, last menstrual period 08/17/2018, SpO2 98 %. General appearance: alert, oriented, NAD Lungs: normal respiratory effort Heart: regular rate Abdomen: soft, non-tender Extremities: No calf swelling or tenderness   Dilation: 10 Effacement (%): 100 Station: Plus 1 Exam by:: Danielle Simpson RN  Prenatal labs: ABO, Rh: --/--/PENDING (  02/08 0125) A+ per [facility] records Antibody: PENDING (02/08 0125) Neg Rubella:  Non-immune RPR:   NR HBsAg:    HIV:   NR GC/Chlamydia: -/neg 12/01/2018  GBS:   unknown 2-hr GTT: 1hr normal Genetic screening:  Quad screen neg Anatomy US: bilateral renal pelvic fullness  Prenatal Transfer Tool  Maternal Diabetes: No Genetic Screening: Normal Maternal Ultrasounds/Referrals: Fetal Kidney Anomalies Fetal Ultrasounds or other Referrals:  None Maternal Substance Abuse:  No Significant Maternal Medications:  None Significant Maternal Lab Results: None  Results for orders placed or performed during the hospital encounter of 06/01/19 (from the past 24 hour(s))  Type and screen [facility]   Collection Time: 06/01/19  1:25 AM  Result Value Ref Range   ABO/RH(D) PENDING    Antibody Screen  PENDING    Sample Expiration      06/04/2019,2359 Performed at [facility], [address] [place], [place] [address]     There are no problems to display for this patient.   Assessment: Patricia House is a 38 y.o. G6P4014 at [redacted]w[redacted]d here after precipitous delivery.   #Labor: s/p precpitious delivery, uncomplicated, L periuretheral not repaired #GBS/ID: unknown #COVID: swab pending #MOF: Breast #MOC: Depo prior to discharge, then would like LARC at [facility] #Circ: n/a  #Rubella NI: MMR PP  #Prenatal records: GN/CT not updated, repeat pending  Matthew M Eckstat 06/01/2019, 1:38 AM   

## 2019-06-02 MED ORDER — IBUPROFEN 600 MG PO TABS: 600.0000 mg | ORAL_TABLET | Freq: Four times a day (QID) | ORAL | 0 refills | Status: DC

## 2019-06-02 MED ORDER — OXYCODONE HCL 5 MG PO TABS: 5.0000 mg | ORAL_TABLET | ORAL | 0 refills | Status: DC | PRN

## 2019-06-02 MED ORDER — MEDROXYPROGESTERONE ACETATE 150 MG/ML IM SUSP
150.0000 mg | Freq: Once | INTRAMUSCULAR | Status: AC
  Administered 2019-06-02: 150 mg via INTRAMUSCULAR
  Filled 2019-06-02: qty 1

## 2019-06-02 MED ORDER — ACETAMINOPHEN 325 MG PO TABS: 650.0000 mg | ORAL_TABLET | ORAL | 0 refills | Status: DC | PRN

## 2019-06-02 NOTE — Progress Notes (Signed)
Interpretor: [id number] and [id number]  

## 2019-06-02 NOTE — Plan of Care (Signed)
  Problem: Education: Goal: Knowledge of condition will improve Outcome: Completed/Met   Problem: Activity: Goal: Will verbalize the importance of balancing activity with adequate rest periods Outcome: Completed/Met   Problem: Life Cycle: Goal: Chance of risk for complications during the postpartum period will decrease Outcome: Completed/Met   Problem: Skin Integrity: Goal: Demonstration of wound healing without infection will improve Outcome: Completed/Met

## 2019-06-02 NOTE — Progress Notes (Signed)
POSTPARTUM PROGRESS NOTE  Post Partum Day 1  Subjective:  Patricia House is a 39 y.o. [id number] s/p NSVD at [redacted]w[redacted]d.  She reports she is doing well. No acute events overnight. She denies any problems with ambulating, voiding or po intake. Denies nausea or vomiting.  Pain is well controlled.  Lochia is appropriate.  Objective: Blood pressure (!) 90/50, pulse 64, temperature (!) 97.5 F (36.4 C), temperature source Oral, resp. rate 18, last menstrual period 08/17/2018, SpO2 98 %, unknown if currently breastfeeding.  Physical Exam:  General: alert, cooperative and no distress Chest: no respiratory distress Heart:regular rate, distal pulses intact Abdomen: soft, nontender,  Uterine Fundus: firm, appropriately tender DVT Evaluation: No calf swelling or tenderness Extremities: no LE edema Skin: warm, dry  Recent Labs    06/01/19 0125  HGB 12.9  HCT 37.7    Assessment/Plan: Patricia House is a 39 y.o. [id number] s/p NSVD at [redacted]w[redacted]d   PPD#1 - Doing well  Routine postpartum care Contraception: Depo prior to discharge, plans on LARC at [facility] Feeding: breast Dispo: Plan for discharge PPD#2 (late delivery, GBS unknown).   LOS: 1 day   Zack Seal, MD/MP[facility] OB Fellow  06/02/2019, 8:17 AM

## 2019-06-02 NOTE — Discharge Instructions (Signed)
Parto vaginal, cuidados de puerperio Postpartum Care After Vaginal Delivery Lea esta informacin sobre cmo cuidarse desde el momento en que nazca su beb y hasta 6 a 12 semanas despus del parto (perodo del posparto). El mdico tambin podr darle instrucciones ms especficas. Comunquese con su mdico si tiene problemas o preguntas. Siga estas indicaciones en su casa: Hemorragia vaginal  Es normal tener un poco de hemorragia vaginal (loquios) despus del parto. Use un apsito sanitario para el sangrado vaginal y secrecin. ? Durante la primera semana despus del parto, la cantidad y el aspecto de los loquios a menudo es similar a las del perodo menstrual. ? Durante las siguientes semanas disminuir gradualmente hasta convertirse en una secrecin seca amarronada o amarillenta. ? En la mayora de las mujeres, los loquios se detienen completamente entre 4 a 6semanas despus del parto. Los sangrados vaginales pueden variar de mujer a mujer.  Cambie los apsitos sanitarios con frecuencia. Observe si hay cambios en el flujo, como: ? Un aumento repentino en el volumen. ? Cambio en el color. ? Cogulos sanguneos grandes.  Si expulsa un cogulo de sangre por la vagina, gurdelo y llame al mdico para informrselo. No deseche los cogulos de sangre por el inodoro antes de hablar con su mdico.  No use tampones ni se haga duchas vaginales hasta que el mdico la autorice.  Si no est amamantando, volver a tener su perodo entre 6 y 8 semanas despus del parto. Si solamente alimenta al beb con leche materna (lactancia materna exclusiva), podra no volver a tener su perodo hasta que deje de amamantar. Cuidados perineales  Mantenga la zona entre la vagina y el ano (perineo) limpia y seca, como se lo haya indicado el mdico. Utilice apsitos o aerosoles analgsicos y cremas, como se lo hayan indicado.  Si le hicieron un corte en el perineo (episiotoma) o tuvo un desgarro en la vagina, controle la  zona para detectar signos de infeccin hasta que sane. Est atenta a los siguientes signos: ? Aumento del enrojecimiento, la hinchazn o el dolor. ? Presenta lquido o sangre que supura del corte o desgarro. ? Calor. ? Pus o mal olor.  Es posible que le den una botella rociadora para que use en lugar de limpiarse el rea con papel higinico despus de usar el bao. Cuando comience a sanar, podr usar la botella rociadora antes de secarse. Asegrese de secarse suavemente.  Para aliviar el dolor causado por una episiotoma, un desgarro en la vagina o venas hinchadas en el ano (hemorroides), trate de tomar un bao de asiento tibio 2 o 3 veces por da. Un bao de asiento es un bao de agua tibia que se toma mientras se est sentado. El agua solo debe llegar hasta las caderas y cubrir las nalgas. Cuidado de las mamas  En los primeros das despus del parto, las mamas pueden sentirse pesadas, llenas e incmodas (congestin mamaria). Tambin puede escaparse leche de sus senos. El mdico puede sugerirle mtodos para aliviar este malestar. La congestin mamaria debera desaparecer al cabo de unos das.  Si est amamantando: ? Use un sostn que sujete y ajuste bien sus pechos. ? Mantenga los pezones secos y limpios. Aplquese cremas y ungentos, como se lo haya indicado el mdico. ? Es posible que deba usar discos de algodn en el sostn para absorber la leche que se filtre de sus senos. ? Puede tener contracciones uterinas cada vez que amamante durante varias semanas despus del parto. Las contracciones uterinas ayudan al tero a   regresar a su tamao habitual. ? Si tiene algn problema con la lactancia materna, colabore con el mdico o un asesor en lactancia.  Si no est amamantando: ? Evite tocarse mucho las mamas. Al hacerlo, podran producir ms leche. ? Use un sostn que le proporcione el ajuste correcto y compresas fras para reducir la hinchazn. ? No extraiga (saque) leche materna. Esto har que  produzca ms leche. Intimidad y sexualidad  Pregntele al mdico cundo puede retomar la actividad sexual. Esto puede depender de lo siguiente: ? Su riesgo de sufrir infecciones. ? La rapidez con la que est sanando. ? Su comodidad y deseo de retomar la actividad sexual.  Despus del parto, puede quedar embarazada incluso si no ha tenido todava su perodo. Si lo desea, hable con el mdico acerca de los mtodos de control de la natalidad (mtodos anticonceptivos). Medicamentos  Tome los medicamentos de venta libre y los recetados solamente como se lo haya indicado el mdico.  Si le recetaron un antibitico, tmelo como se lo haya indicado el mdico. No deje de tomar el antibitico aunque comience a sentirse mejor. Actividad  Retome sus actividades normales de a poco como se lo haya indicado el mdico. Pregntele al mdico qu actividades son seguras para usted.  Descanse todo lo que pueda. Trate de descansar o tomar una siesta mientras el beb duerme. Comida y bebida   Beba suficiente lquido como para mantener la orina de color amarillo plido.  Coma alimentos ricos en fibras todos los das. Estos pueden ayudarla a prevenir o aliviar el estreimiento. Los alimentos ricos en fibras incluyen, entre otros: ? Panes y cereales integrales. ? Arroz integral. ? Frijoles. ? Frutas y verduras frescas.  No intente perder de peso rpidamente reduciendo el consumo de caloras.  Tome sus vitaminas prenatales hasta la visita de seguimiento de posparto o hasta que su mdico le indique que puede dejar de tomarlas. Estilo de vida  No consuma ningn producto que contenga nicotina o tabaco, como cigarrillos y cigarrillos electrnicos. Si necesita ayuda para dejar de fumar, consulte al mdico.  No beba alcohol, especialmente si est amamantando. Instrucciones generales  Concurra a todas las visitas de seguimiento para usted y el beb, como se lo haya indicado el mdico. La mayora de las mujeres  visita al mdico para un seguimiento de posparto dentro de las primeras 3 a 6 semanas despus del parto. Comunquese con un mdico si:  Se siente incapaz de controlar los cambios que implica tener un hijo y esos sentimientos no desaparecen.  Siente tristeza o preocupacin de forma inusual.  Las mamas se ponen rojas, le duelen o se endurecen.  Tiene fiebre.  Tiene dificultad para retener la orina o para impedir que la orina se escape.  Tiene poco inters o falta de inters en actividades que solan gustarle.  No ha amamantado nada y no ha tenido un perodo menstrual durante 12 semanas despus del parto.  Dej de amamantar al beb y no ha tenido su perodo menstrual durante 12 semanas despus de dejar de amamantar.  Tiene preguntas sobre su cuidado y el del beb.  Elimina un cogulo de sangre grande por la vagina. Solicite ayuda de inmediato si:  Siente dolor en el pecho.  Tiene dificultad para respirar.  Tiene un dolor repentino e intenso en la pierna.  Tiene dolor intenso o clicos en el la parte inferior del abdomen.  Tiene una hemorragia tan intensa de la vagina que empapa ms de un apsito en una hora. El sangrado   no debe ser ms abundante que el perodo ms intenso que haya tenido.  Dolor de cabeza intenso.  Se desmaya.  Tiene visin borrosa o manchas en la vista.  Tiene secrecin vaginal con mal olor.  Tiene pensamientos acerca de lastimarse a usted misma o a su beb. Si alguna vez siente que puede lastimarse a usted misma o a otras personas, o tiene pensamientos de poner fin a su vida, busque ayuda de inmediato. Puede dirigirse al departamento de emergencias ms cercano o llamar a:  El servicio de emergencias de su localidad (911 en [place]  Una lnea de asistencia al suicida y atencin en crisis, como la Lnea Nacional de Prevencin del Suicidio (National Suicide Prevention Lifeline), al 1-800-273-8255. Est disponible las 24 horas del da. Resumen  El  perodo de tiempo justo despus el parto y hasta 6 a 12 semanas despus del parto se denomina perodo posparto.  Retome sus actividades normales de a poco como se lo haya indicado el mdico.  Concurra a todas las visitas de seguimiento para usted y el beb, como se lo haya indicado el mdico. Esta informacin no tiene como fin reemplazar el consejo del mdico. Asegrese de hacerle al mdico cualquier pregunta que tenga. Document Revised: 07/20/2017 Document Reviewed: 03/31/2017 Elsevier Patient Education  2020 Elsevier Inc.  

## 2022-03-23 ENCOUNTER — Other Ambulatory Visit: Payer: Self-pay | Admitting: Obstetrics and Gynecology

## 2022-03-23 ENCOUNTER — Ambulatory Visit
Admission: RE | Admit: 2022-03-23 | Discharge: 2022-03-23 | Disposition: A | Discharge status: Home / Self Care | Payer: No Typology Code available for payment source | Source: Ambulatory Visit | Attending: Obstetrics and Gynecology | Admitting: Obstetrics and Gynecology

## 2022-03-23 DIAGNOSIS — R7611 Nonspecific reaction to tuberculin skin test without active tuberculosis: Secondary | ICD-10-CM

## 2022-04-23 NOTE — L&D Delivery Note (Signed)
Delivery Note 42 y.o. [id number] at [redacted]w[redacted]d admitted with spontaneous onset of labor.   At 7:25 AM a viable female was delivered via Vaginal, Spontaneous (Presentation: Right Occiput Anterior).  APGAR: 8, 9; weight 9 lb 3 oz (4167 g).  Tight nuchal and body cord noted, reduced after delivery of the baby. Placenta status: Spontaneous;Expressed, Intact.  Cord: 3 vessels with the following complications: None.  Cord pH: not collected  Anesthesia: Local Episiotomy: None Lacerations: 2nd degree;Perineal Suture Repair: 3.0 vicryl Est. Blood Loss (mL): 196  Mom to postpartum.  Baby to Couplet care / Skin to Skin.  Liliane Channel MD MPH OB Fellow, [place] for [place] 05/21/2022

## 2022-05-21 ENCOUNTER — Encounter (HOSPITAL_COMMUNITY): Payer: Self-pay

## 2022-05-21 ENCOUNTER — Inpatient Hospital Stay (HOSPITAL_COMMUNITY)
Admission: AD | Admit: 2022-05-21 | Discharge: 2022-05-23 | DRG: 806 | Disposition: A | Discharge status: Home / Self Care | Payer: Medicaid Other | Attending: Obstetrics & Gynecology | Admitting: Obstetrics & Gynecology

## 2022-05-21 DIAGNOSIS — O99824 Streptococcus B carrier state complicating childbirth: Secondary | ICD-10-CM | POA: Diagnosis present

## 2022-05-21 DIAGNOSIS — Z3A41 41 weeks gestation of pregnancy: Secondary | ICD-10-CM | POA: Diagnosis not present

## 2022-05-21 DIAGNOSIS — Z227 Latent tuberculosis: Secondary | ICD-10-CM

## 2022-05-21 DIAGNOSIS — R030 Elevated blood-pressure reading, without diagnosis of hypertension: Secondary | ICD-10-CM | POA: Diagnosis not present

## 2022-05-21 DIAGNOSIS — O480 Post-term pregnancy: Principal | ICD-10-CM | POA: Diagnosis present

## 2022-05-21 DIAGNOSIS — N858 Other specified noninflammatory disorders of uterus: Principal | ICD-10-CM | POA: Diagnosis present

## 2022-05-21 DIAGNOSIS — O9802 Tuberculosis complicating childbirth: Secondary | ICD-10-CM | POA: Diagnosis present

## 2022-05-21 DIAGNOSIS — O99893 Other specified diseases and conditions complicating puerperium: Secondary | ICD-10-CM | POA: Diagnosis not present

## 2022-05-21 LAB — RPR: RPR Ser Ql: NONREACTIVE

## 2022-05-21 LAB — CBC
HCT: 35.1 % — ABNORMAL LOW (ref 36.0–46.0)
Hemoglobin: 11.7 g/dL — ABNORMAL LOW (ref 12.0–15.0)
MCH: 26.5 pg (ref 26.0–34.0)
MCHC: 33.3 g/dL (ref 30.0–36.0)
MCV: 79.6 fL — ABNORMAL LOW (ref 80.0–100.0)
Platelets: 268 10*3/uL (ref 150–400)
RBC: 4.41 MIL/uL (ref 3.87–5.11)
RDW: 14.8 % (ref 11.5–15.5)
WBC: 9.4 10*3/uL (ref 4.0–10.5)
nRBC: 0 % (ref 0.0–0.2)

## 2022-05-21 MED ORDER — IBUPROFEN 600 MG PO TABS
600.0000 mg | ORAL_TABLET | Freq: Four times a day (QID) | ORAL | Status: DC
  Administered 2022-05-21 – 2022-05-23 (×7): 600 mg via ORAL
  Filled 2022-05-21 (×8): qty 1

## 2022-05-21 MED ORDER — DIPHENHYDRAMINE HCL 25 MG PO CAPS: 25.0000 mg | ORAL_CAPSULE | Freq: Four times a day (QID) | ORAL | Status: DC | PRN

## 2022-05-21 MED ORDER — LIDOCAINE HCL (PF) 1 % IJ SOLN
30.0000 mL | INTRAMUSCULAR | Status: AC | PRN
  Administered 2022-05-21: 30 mL via SUBCUTANEOUS
  Filled 2022-05-21: qty 30

## 2022-05-21 MED ORDER — OXYTOCIN-SODIUM CHLORIDE 30-0.9 UT/500ML-% IV SOLN
2.5000 [IU]/h | INTRAVENOUS | Status: DC
  Administered 2022-05-21: 2.5 [IU]/h via INTRAVENOUS
  Filled 2022-05-21: qty 500

## 2022-05-21 MED ORDER — TETANUS-DIPHTH-ACELL PERTUSSIS 5-2.5-18.5 LF-MCG/0.5 IM SUSY: 0.5000 mL | PREFILLED_SYRINGE | Freq: Once | INTRAMUSCULAR | Status: DC

## 2022-05-21 MED ORDER — COCONUT OIL OIL: 1.0000 | TOPICAL_OIL | Status: DC | PRN

## 2022-05-21 MED ORDER — FENTANYL CITRATE (PF) 100 MCG/2ML IJ SOLN: 50.0000 ug | INTRAMUSCULAR | Status: DC | PRN

## 2022-05-21 MED ORDER — SOD CITRATE-CITRIC ACID 500-334 MG/5ML PO SOLN: 30.0000 mL | ORAL | Status: DC | PRN

## 2022-05-21 MED ORDER — OXYTOCIN BOLUS FROM INFUSION
333.0000 mL | Freq: Once | INTRAVENOUS | Status: AC
  Administered 2022-05-21: 333 mL via INTRAVENOUS

## 2022-05-21 MED ORDER — TRANEXAMIC ACID-NACL 1000-0.7 MG/100ML-% IV SOLN
1000.0000 mg | Freq: Once | INTRAVENOUS | Status: AC
  Administered 2022-05-21: 1000 mg via INTRAVENOUS
  Filled 2022-05-21: qty 100

## 2022-05-21 MED ORDER — FLEET ENEMA 7-19 GM/118ML RE ENEM: 1.0000 | ENEMA | RECTAL | Status: DC | PRN

## 2022-05-21 MED ORDER — OXYCODONE-ACETAMINOPHEN 5-325 MG PO TABS: 2.0000 | ORAL_TABLET | ORAL | Status: DC | PRN

## 2022-05-21 MED ORDER — SENNOSIDES-DOCUSATE SODIUM 8.6-50 MG PO TABS
2.0000 | ORAL_TABLET | ORAL | Status: DC
  Administered 2022-05-21 – 2022-05-23 (×3): 2 via ORAL
  Filled 2022-05-21 (×3): qty 2

## 2022-05-21 MED ORDER — WITCH HAZEL-GLYCERIN EX PADS: 1.0000 | MEDICATED_PAD | CUTANEOUS | Status: DC | PRN

## 2022-05-21 MED ORDER — ACETAMINOPHEN 325 MG PO TABS: 650.0000 mg | ORAL_TABLET | ORAL | Status: DC | PRN

## 2022-05-21 MED ORDER — ONDANSETRON HCL 4 MG PO TABS: 4.0000 mg | ORAL_TABLET | ORAL | Status: DC | PRN

## 2022-05-21 MED ORDER — LACTATED RINGERS IV SOLN: INTRAVENOUS | Status: DC

## 2022-05-21 MED ORDER — DIBUCAINE (PERIANAL) 1 % EX OINT: 1.0000 | TOPICAL_OINTMENT | CUTANEOUS | Status: DC | PRN

## 2022-05-21 MED ORDER — SODIUM CHLORIDE 0.9 % IV SOLN
2.0000 g | Freq: Once | INTRAVENOUS | Status: AC
  Administered 2022-05-21: 2 g via INTRAVENOUS
  Filled 2022-05-21: qty 2000

## 2022-05-21 MED ORDER — PRENATAL MULTIVITAMIN CH
1.0000 | ORAL_TABLET | Freq: Every day | ORAL | Status: DC
  Administered 2022-05-21 – 2022-05-23 (×3): 1 via ORAL
  Filled 2022-05-21 (×3): qty 1

## 2022-05-21 MED ORDER — ZOLPIDEM TARTRATE 5 MG PO TABS: 5.0000 mg | ORAL_TABLET | Freq: Every evening | ORAL | Status: DC | PRN

## 2022-05-21 MED ORDER — BENZOCAINE-MENTHOL 20-0.5 % EX AERO
1.0000 | INHALATION_SPRAY | CUTANEOUS | Status: DC | PRN
  Administered 2022-05-23: 1 via TOPICAL
  Filled 2022-05-21: qty 56

## 2022-05-21 MED ORDER — OXYCODONE-ACETAMINOPHEN 5-325 MG PO TABS: 1.0000 | ORAL_TABLET | ORAL | Status: DC | PRN

## 2022-05-21 MED ORDER — SODIUM CHLORIDE 0.9 % IV SOLN: 1.0000 g | INTRAVENOUS | Status: DC

## 2022-05-21 MED ORDER — SIMETHICONE 80 MG PO CHEW: 80.0000 mg | CHEWABLE_TABLET | ORAL | Status: DC | PRN

## 2022-05-21 MED ORDER — ONDANSETRON HCL 4 MG/2ML IJ SOLN: 4.0000 mg | Freq: Four times a day (QID) | INTRAMUSCULAR | Status: DC | PRN

## 2022-05-21 MED ORDER — LACTATED RINGERS IV SOLN: 500.0000 mL | INTRAVENOUS | Status: DC | PRN

## 2022-05-21 MED ORDER — ONDANSETRON HCL 4 MG/2ML IJ SOLN: 4.0000 mg | INTRAMUSCULAR | Status: DC | PRN

## 2022-05-21 NOTE — Lactation Note (Signed)
This note was copied from a baby's chart. Lactation Consultation Note  Patient Name: Patricia House [id number] Date: 05/21/2022 Reason for consult: Initial assessment;Term Age:40 hours   P6: Term infant at 41+4 weeks  In house Spanish interpreter used for interpretation.  Mother declined lactation services.   Maternal Data    Feeding Mother's Current Feeding Choice: Breast Milk and Formula  LATCH Score                    Lactation Tools Discussed/Used    Interventions    Discharge    Consult Status Consult Status: Complete (mother declined follow up)    Patricia House 05/21/2022, 12:00 PM

## 2022-05-21 NOTE — Discharge Summary (Addendum)
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 yo [id number] at [redacted]w[redacted]d was admitted in Active Labor on 05/21/2022. Labor course was complicated by: none  Membrane Rupture Time/Date: 6:46 AM ,05/21/2022   Delivery Method:Vaginal, Spontaneous  Episiotomy: None  Lacerations:  2nd degree;Perineal  Patient had a postpartum course that has been uncomplicated.  She is ambulating, tolerating a regular diet, passing flatus, and urinating well. Patient is discharged home in stable condition on 05/23/22.  Newborn Data: Birth date:05/21/2022  Birth time:7:25 AM  Gender:Female  Living status:Living  Apgars:8 ,9  Weight:4167 g   Magnesium Sulfate received: No BMZ received: No Rhophylac:N/A MMR:N/A; rubella immune T-DaP:Given prenatally Transfusion:No  Physical exam  Vitals:   05/21/22 2142 05/22/22 0525 05/22/22 1352 05/22/22 1900  BP: 122/65 110/74 107/64 119/74  Pulse: 80 76 79 81  Resp: 18 18 17 16   Temp: 98.4 F (36.9 C) 97.6 F (36.4 C) 98.1 F (36.7 C) 99 F (37.2 C)  TempSrc: Oral Oral Oral Oral  SpO2: 99% 98% 99% 98%  Weight:      Height:       General: alert, cooperative, and no  distress Lochia: appropriate Uterine Fundus: firm Incision: N/A DVT Evaluation: No evidence of DVT seen on physical exam. Negative Homan's sign. No cords or calf tenderness. No significant calf/ankle edema. Calf/Ankle edema is present Labs: Lab Results  Component Value Date   WBC 9.4 05/21/2022   HGB 11.7 (L) 05/21/2022   HCT 35.1 (L) 05/21/2022   MCV 79.6 (L) 05/21/2022   PLT 268 05/21/2022       No data to display         Edinburgh Score:    05/21/2022    2:22 PM  Edinburgh Postnatal Depression Scale Screening Tool  I have been able to laugh and see the funny side of things. 0  I have looked forward with enjoyment to things. 1  I have blamed myself unnecessarily when things went wrong. 1  I have been anxious or worried for no good reason. 2  I have felt scared or panicky for no good reason. 2  Things have been getting on top of me. 2  I have been so unhappy that I have had difficulty sleeping. 1  I have felt sad or miserable. 0  I have been so unhappy that I have been crying. 0  The thought of harming myself has occurred to me. 0  Edinburgh Postnatal Depression Scale Total 9     After visit meds:  Allergies as of 05/23/2022  No Known Allergies      Medication List     STOP taking these medications    acetaminophen 325 MG tablet Commonly known as: Tylenol   oxyCODONE 5 MG immediate release tablet Commonly known as: Oxy IR/ROXICODONE       TAKE these medications    benzocaine-Menthol 20-0.5 % Aero Commonly known as: DERMOPLAST Apply 1 Application topically as needed for irritation (perineal discomfort).   ibuprofen 600 MG tablet Commonly known as: ADVIL Take 1 tablet (600 mg total) by mouth every 6 (six) hours as needed for up to 30 doses. What changed:  when to take this reasons to take this   PRENATAL VITAMIN PO Take by mouth.   senna-docusate 8.6-50 MG tablet Commonly known as: Senokot-S Take 2 tablets by mouth daily.   simethicone  80 MG chewable tablet Commonly known as: MYLICON Chew 1 tablet (80 mg total) by mouth as needed for flatulence.         Discharge home in stable condition Infant Feeding: Breast Infant Disposition:home with mother Discharge instruction: per After Visit Summary and Postpartum booklet. Activity: Advance as tolerated. Pelvic rest for 6 weeks.  Diet: routine diet Future Appointments:No future appointments. 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  Patient will follow up with [facility] in 6 weeks for post partum follow up.  She will like a nexplanon insertion.   05/23/2022 Abram Sander, MD ___ GME ATTESTATION:  Evaluation and management procedures were performed by the [facility] Medicine Resident under my supervision. I was immediately available for direct supervision, assistance and direction throughout this encounter.  I also confirm that I have verified the information documented in the resident's note, and that I have also personally reperformed the pertinent components of the physical exam and all of the medical decision making activities.  I have also made any necessary editorial changes.  Shelda Pal, DO OB Fellow, [place] for [place] 05/23/2022 1:44 PM

## 2022-05-21 NOTE — MAU Note (Signed)
.  Patricia House is a 42 y.o. at [redacted]w[redacted]d here in MAU reporting: contractions starting at 0220 every 10 minutes.  Patient denies VB, LOF and endorses +FM.   GBS +  Onset of complaint: 05/21/22 Pain score: 8/10 Vitals:   05/21/22 0334  BP: (!) 150/92  Pulse: (!) 114  Resp: 18  Temp: 98.2 F (36.8 C)  SpO2: 99%     FHT:120 Lab orders placed from triage:  mau labor

## 2022-05-21 NOTE — H&P (Signed)
OBSTETRIC ADMISSION HISTORY AND PHYSICAL  Patricia House is a 42 y.o. female [phone] with IUP at [redacted]w[redacted]d by 52 week [place] presenting for spontaneous onset of labor. She reports +FMs, No LOF, no VB, no blurry vision, headaches or peripheral edema, and RUQ pain.  She plans on both breast and bottle feeding. She request nexplanon for birth control. 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 with neg HPV - recheck pap in 3 yearsl Advanced maternal age  Past Medical History: Past Medical History:  Diagnosis Date   Medical history non-contributory     Past Surgical History: Past Surgical History:  Procedure Laterality Date   NO PAST SURGERIES      Obstetrical History: OB History     Gravida  7   Para  5   Term  5   Preterm      AB  1   Living  5      SAB  1   IAB      Ectopic      Multiple  0   Live Births  1           Social History Social History   Socioeconomic History   Marital status: Unknown    Spouse name: Not on file   Number of children: Not on file   Years of education: Not on file   Highest education level: Not on file  Occupational History   Not on file  Tobacco Use   Smoking status: Never   Smokeless tobacco: Never  Vaping Use   Vaping Use: Never used  Substance and Sexual Activity   Alcohol use: Not Currently   Drug use: Never   Sexual activity: Yes    Birth control/protection: None  Other Topics Concern   Not on file  Social History Narrative   Not on file   Social Determinants of Health   Financial Resource Strain: Not on file  Food Insecurity: No Food Insecurity (05/21/2022)   Hunger Vital Sign    Worried About Running Out of Food in the Last Year: Never true    Ran Out of Food in the Last Year: Never true   Transportation Needs: No Transportation Needs (05/21/2022)   PRAPARE - Hydrologist (Medical): No    Lack of Transportation (Non-Medical): No  Physical Activity: Not on file  Stress: Not on file  Social Connections: Not on file    Family History: History reviewed. No pertinent family history.  Allergies: No Known Allergies  Medications Prior to Admission  Medication Sig Dispense Refill Last Dose   acetaminophen (TYLENOL) 325 MG tablet Take 2 tablets (650 mg total) by mouth every 4 (four) hours as needed (for pain scale < 4). 30 tablet 0    ibuprofen (ADVIL) 600 MG tablet Take 1 tablet (600 mg total) by mouth every 6 (six) hours. 30 tablet 0    oxyCODONE (OXY IR/ROXICODONE) 5 MG immediate release tablet Take 1 tablet (5 mg total) by mouth every 4 (four) hours as needed (pain scale 4-7). 30 tablet 0    Prenatal Vit-Fe Fumarate-FA (PRENATAL VITAMIN PO) Take by mouth.        Review of Systems   All systems reviewed and negative except as stated in HPI  Blood pressure (!) 150/92, pulse (!) 114, temperature 98.2 F (36.8 C), temperature source Oral, resp. rate 18, height 5' 2.99" (1.6 m), weight 88.2 kg, SpO2 99 %, unknown if currently breastfeeding.  General appearance: alert, cooperative, and appears stated age Lungs: clear to auscultation bilaterally Heart: regular rate and rhythm Abdomen: soft, non-tender; bowel sounds normal Pelvic: see below Extremities: Homans sign is negative, no sign of DVT  Presentation: cephalic  Fetal monitoring:  120bpm, moderate variability, +accels, no decels Uterine activity:  contractions every 3 mins, moderate.  Dilation: 8 Effacement (%): 90 (bulging bag) Station: -2, -1 Exam by:: Osvaldo Human RN   Prenatal labs: ABO, Rh:  A positive Antibody:  Negative Rubella:  Immune RPR:   Non-reactive HBsAg:   Non reactive HIV:   Non reactive GBS:   positive 1 hr Glucola abnormal, passed 3hr GTT Genetic  screening  LR NIPs Anatomy US -Normal  Prenatal Transfer Tool  Maternal Diabetes: No Genetic Screening: Normal Maternal Ultrasounds/Referrals: Normal Fetal Ultrasounds or other Referrals:  None Maternal Substance Abuse:  No Significant Maternal Medications:  None Significant Maternal Lab Results:  Group B Strep positive Number of Prenatal Visits:Less than or equal to 3 verified prenatal visits Other Comments:  None  No results found for this or any previous visit (from the past 24 hour(s)).  Patient Active Problem List   Diagnosis Date Noted   Alteration in comfort associated with uterine contractions 05/21/2022   NSVD (normal spontaneous vaginal delivery) 06/01/2019    Assessment/Plan:  Patricia House is a 42 y.o. [id number] at [redacted]w[redacted]d here for spontaneous onset of labor. She received prenatal care at the [facility]. OB concerns include AMA, and grandmultiparity. Will plan for TXA after clamping cord.  #Labor:Admit to L&D #Pain: Declines analgesia for now #FWB: Cat 1 #ID:  GBS positive. - ampicillin ordered #MOF: breast and bottle #MOC:nexplanon at [facility] #Circ:  Declines.  Liliane Channel MD MPH OB Fellow, [place] for [place] 05/21/2022

## 2022-05-22 NOTE — Social Work (Signed)
[name] received and acknowledges consult for EDPS of 9.  Consult screened out due to 9 on EDPS does not warrant a [name] consult.  [name] whom scores are greater than 9/yes to question 10 on Edinburgh Postpartum Depression Screen warrants a [name] consult.   Johannah Rozas, LCSWA Clinical Social Worker [phone]  

## 2022-05-22 NOTE — Progress Notes (Signed)
POSTPARTUM PROGRESS NOTE  Post Partum Day 1  Subjective:  Patricia House is a 42 y.o. [id number] s/p NSVD at [redacted]w[redacted]d.  No acute events overnight.  Pt denies problems with ambulating, voiding or po intake.  She denies nausea or vomiting.  Pain is well controlled.  She has not had flatus. She has not had bowel movement.  Lochia Minimal.   Objective: Blood pressure 110/74, pulse 76, temperature 97.6 F (36.4 C), temperature source Oral, resp. rate 18, height 5' 2.99" (1.6 m), weight 88.2 kg, SpO2 98 %, unknown if currently breastfeeding.  Physical Exam:  General: alert, cooperative and no distress Chest: no respiratory distress Heart:regular rate, distal pulses intact Abdomen: soft, nontender,  Uterine Fundus: firm, appropriately tender DVT Evaluation: No calf swelling or tenderness Extremities: No edema Skin: warm, dry; incision clean/dry/intact  Recent Labs    05/21/22 0400  HGB 11.7*  HCT 35.1*    Assessment/Plan: Patricia House is a 42 y.o. [id number] s/p NSVD at [redacted]w[redacted]d   PPD#1 - Doing well Contraception: OP Nexplanon Anti-Lewis B antibody positive: unit available and cross matched if needed Feeding: breast  #Elevated Bps: improved as of this morning without any intervention. Asymptomatic, after discussion with postpartum floor nursing they are unsure if those Bps are even accurate. Will watch closely and if she has another repeat elevated BP will obtain pre-E labs and consider BP meds.   Dispo: Plan for discharge Tomorrow.     LOS: 1 day   Patricia Sander, MD PGY1 05/22/2022, 7:19 AM

## 2022-05-23 ENCOUNTER — Other Ambulatory Visit (HOSPITAL_COMMUNITY): Payer: Self-pay

## 2022-05-23 MED ORDER — SIMETHICONE 80 MG PO CHEW
80.0000 mg | CHEWABLE_TABLET | ORAL | 0 refills | Status: AC | PRN
  Filled 2022-05-23: qty 30, 30d supply, fill #0

## 2022-05-23 MED ORDER — IBUPROFEN 600 MG PO TABS
600.0000 mg | ORAL_TABLET | Freq: Four times a day (QID) | ORAL | 0 refills | Status: AC | PRN
  Filled 2022-05-23: qty 30, 8d supply, fill #0

## 2022-05-23 MED ORDER — MEDROXYPROGESTERONE ACETATE 150 MG/ML IM SUSP
150.0000 mg | Freq: Once | INTRAMUSCULAR | Status: AC
  Administered 2022-05-23: 150 mg via INTRAMUSCULAR
  Filled 2022-05-23: qty 1

## 2022-05-23 MED ORDER — BENZOCAINE-MENTHOL 20-0.5 % EX AERO
1.0000 | INHALATION_SPRAY | CUTANEOUS | 0 refills | Status: AC | PRN
  Filled 2022-05-23: qty 56, fill #0

## 2022-05-23 MED ORDER — SENNOSIDES-DOCUSATE SODIUM 8.6-50 MG PO TABS
2.0000 | ORAL_TABLET | ORAL | 0 refills | Status: AC
  Filled 2022-05-23: qty 30, 15d supply, fill #0

## 2022-05-25 LAB — BPAM RBC
Blood Product Expiration Date: 202402212359
Blood Product Expiration Date: 202402212359
Unit Type and Rh: 6200
Unit Type and Rh: 6200

## 2022-05-25 LAB — TYPE AND SCREEN
ABO/RH(D): A POS
Antibody Screen: POSITIVE
Unit division: 0
Unit division: 0

## 2022-06-01 ENCOUNTER — Telehealth (HOSPITAL_COMMUNITY): Payer: Self-pay | Admitting: *Deleted

## 2022-06-01 NOTE — Telephone Encounter (Signed)
Number has been disconnected per interpreter.  Odis Hollingshead, RN 06-01-2022 at 3:24pm
